# Patient Record
Sex: Female | Born: 1980 | Race: White | Hispanic: No | Marital: Single | Smoking: Current every day smoker
Health system: Southern US, Community
[De-identification: ages and names within clinical notes are randomized; demographics above are authoritative.]

## PROBLEM LIST (undated history)

## (undated) HISTORY — PX: ABDOMINAL HYSTERECTOMY: SHX81

## (undated) HISTORY — PX: MASTECTOMY PARTIAL / LUMPECTOMY: SUR851

---

## 2015-05-01 ENCOUNTER — Emergency Department (HOSPITAL_COMMUNITY)
Admission: EM | Admit: 2015-05-01 | Discharge: 2015-05-01 | Disposition: A | Discharge status: Home / Self Care | Payer: Self-pay | Attending: Emergency Medicine | Admitting: Emergency Medicine

## 2015-05-01 ENCOUNTER — Emergency Department (HOSPITAL_COMMUNITY): Payer: Self-pay

## 2015-05-01 ENCOUNTER — Encounter (HOSPITAL_COMMUNITY): Payer: Self-pay | Admitting: Emergency Medicine

## 2015-05-01 DIAGNOSIS — S83004A Unspecified dislocation of right patella, initial encounter: Secondary | ICD-10-CM | POA: Insufficient documentation

## 2015-05-01 DIAGNOSIS — F17200 Nicotine dependence, unspecified, uncomplicated: Secondary | ICD-10-CM | POA: Insufficient documentation

## 2015-05-01 DIAGNOSIS — W01198A Fall on same level from slipping, tripping and stumbling with subsequent striking against other object, initial encounter: Secondary | ICD-10-CM | POA: Insufficient documentation

## 2015-05-01 DIAGNOSIS — Y9389 Activity, other specified: Secondary | ICD-10-CM | POA: Insufficient documentation

## 2015-05-01 DIAGNOSIS — Y9289 Other specified places as the place of occurrence of the external cause: Secondary | ICD-10-CM | POA: Insufficient documentation

## 2015-05-01 DIAGNOSIS — Y998 Other external cause status: Secondary | ICD-10-CM | POA: Insufficient documentation

## 2015-05-01 MED ORDER — OXYCODONE HCL 5 MG PO TABS: 2.5000 mg | ORAL_TABLET | Freq: Four times a day (QID) | ORAL | Status: AC | PRN

## 2015-05-01 MED ORDER — OXYCODONE HCL 5 MG PO TABS
5.0000 mg | ORAL_TABLET | Freq: Once | ORAL | Status: AC
Start: 2015-05-01 — End: 2015-05-01
  Administered 2015-05-01: 5 mg via ORAL
  Filled 2015-05-01: qty 1

## 2015-05-01 NOTE — ED Provider Notes (Signed)
CSN: [id number]     Arrival date & time 05/01/15  1538 History  By signing my name below, I, [name], attest that this documentation has been prepared under the direction and in the presence of non-physician practitioner, Wynetta Emery, PA-C. Electronically Signed: [name], Scribe. 05/01/2015. 4:54 PM.   Chief Complaint  Patient presents with  . Knee Pain    right    The history is provided by the patient. No language interpreter was used.     HPI Comments: Alexa Mitchell is a 35 y.o. female brought in by EMS who presents to the Emergency Department complaining of sudden onset, constant, right knee pain s/p injury sustained yesterday. Pt states that she slipped and fell in a port-a-potty yesterday and struck her right knee on the side of the port-a-potty; her right knee popped out of place and she states that she popped her right knee back into place after the fall. Pt applied ice to her right knee last night and has taken ibuprofen both yesterday and today with no relief. She reports that she stood up today and hyperextended her right knee, so she called EMS and came to the ER due to significant pain. Pt notes that she did not suffer a laceration to her right knee, only bruising. Pt states that she is not a local resident. She is allergic to Toradol and Tramadol. She denies any other pains.   History reviewed. No pertinent past medical history. Past Surgical History  Procedure Laterality Date  . Abdominal hysterectomy    . Mastectomy partial / lumpectomy     No family history on file. Social History  Substance Use Topics  . Smoking status: Current Every Day Smoker -- 0.50 packs/day  . Smokeless tobacco: None  . Alcohol Use: Yes     Comment: socially    OB History    No data available     Review of Systems  A complete 10 system review of systems was obtained and all systems are negative except as noted in the HPI and PMH.   Allergies  Toradol and Tramadol  Home  Medications   Prior to Admission medications   Not on File   BP 120/82 mmHg  Pulse 90  Temp(Src) 98 F (36.7 C) (Oral)  Resp 18  Ht  (1.626 m)  Wt 145 lb (65.772 kg)  BMI 24.88 kg/m2  SpO2 100% Physical Exam  Constitutional: She is oriented to person, place, and time. She appears well-developed and well-nourished. No distress.  HENT:  Head: Normocephalic and atraumatic.  Eyes: Conjunctivae and EOM are normal.  Neck: Neck supple. No tracheal deviation present.  Cardiovascular: Normal rate.   Pulmonary/Chest: Effort normal. No respiratory distress.  Musculoskeletal: Normal range of motion. She exhibits tenderness.  Right knee:  No deformity, erythema or abrasions. FROM. Diffusely TTP. No effusion or crepitance. Anterior and posterior drawer show no abnormal laxity. Stable to valgus and varus stress. Joint lines are non-tender. Neurovascularly intact. Pt ambulates with antalgic gait.    Neurological: She is alert and oriented to person, place, and time.  Skin: Skin is warm and dry.  Psychiatric: She has a normal mood and affect. Her behavior is normal.  Nursing note and vitals reviewed.   ED Course  Procedures (including critical care time)  DIAGNOSTIC STUDIES: Oxygen Saturation is 100% on RA, normal by my interpretation.    COORDINATION OF CARE: 4:54 PM Discussed treatment plan with pt at bedside and pt agreed to plan.  Labs  Review Labs Reviewed - No data to display  Imaging Review Dg Knee Complete 4 Views Right  05/01/2015  CLINICAL DATA:  Right knee pain after fall yesterday, dislocation yesterday. EXAM: RIGHT KNEE - COMPLETE 4+ VIEW COMPARISON:  None. FINDINGS: Osseous alignment is normal at this time. No fracture line or displaced fracture fragment identified. Overall bone mineralization is normal. No acute or suspicious osseous lesion. There is a probable joint effusion within the suprapatellar bursa. The surrounding superficial soft tissues are unremarkable.  IMPRESSION: 1. No osseous fracture or dislocation at this time. 2. Probable joint effusion. Electronically Signed   By: Bary RichardStan  Maynard M.D.   On: 05/01/2015 16:45   I have personally reviewed and evaluated these images and lab results as part of my medical decision-making.   EKG Interpretation None      MDM   Final diagnoses:  Patellar dislocation, right, initial encounter    Filed Vitals:   05/01/15 1553 05/01/15 1729  BP: 120/82   Pulse: 90 88  Temp: 98 F (36.7 C)   TempSrc: Oral   Resp: 18   Height: 5\' 4"  (1.626 m)   Weight: 96.04565.772 kg   SpO2: 100%     Medications  oxyCODONE (Oxy IR/ROXICODONE) immediate release tablet 5 mg (5 mg Oral Given 05/01/15 1728)    Alexa Mitchell is 35 y.o. female presenting with Right knee pain after she slipped yesterday. Patient describes what may have been a patellar dislocation which she reduced herself. She continues to have pain, neurovascularly intact, no gross ligamentous instability. X-ray negative. Patient is given crutches, knee immobilizer and orthopedic referral. She has anaphylactic reaction to both Toradol and tramadol, short course of oxycodone for pain.  Evaluation does not show pathology that would require ongoing emergent intervention or inpatient treatment. Pt is hemodynamically stable and mentating appropriately. Discussed findings and plan with patient/guardian, who agrees with care plan. All questions answered. Return precautions discussed and outpatient follow up given.   Discharge Medication List as of 05/01/2015  5:06 PM    START taking these medications   Details  oxyCODONE (ROXICODONE) 5 MG immediate release tablet Take 0.5-1 tablets (2.5-5 mg total) by mouth every 6 (six) hours as needed., Starting 05/01/2015, Until Discontinued, Print         I personally performed the services described in this documentation, which was scribed in my presence. The recorded information has been reviewed and is accurate.   Wynetta Emeryicole  Diago Haik, PA-C 05/02/15 1012  Arby BarretteMarcy Pfeiffer, MD 05/04/15 2256

## 2015-05-01 NOTE — ED Notes (Signed)
Per EMS pt was in a port-a-potty yesterday and slipped. Pt states her knee "popped out of place and then she pushed it back into place". Pt states she iced it and took motrin last night and today. Pt states that today when she went to stand up, she hyperextended her knee and now she can not manage her pain with motrin.

## 2015-05-01 NOTE — Discharge Instructions (Signed)
Take oxycodone for breakthrough pain, do not drink alcohol, drive, care for children or do other critical tasks while taking oxycodone.  Keep the knee immobilizer on at all times including when sleeping except when bathing. Do not stop using the knee immobilizer until you are cleared by your orthopedist.  Do not hesitate to return to the emergency room for any new, worsening or concerning symptoms.  Please obtain primary care using resource guide below. Let them know that you were seen in the emergency room and that they will need to obtain records for further outpatient management.    Patellar Dislocation A patellar dislocation occurs when your kneecap (patella) slips out of its normal position in a groove in front of the lower end of your thighbone (femur). This groove is called the patellofemoral groove.  CAUSES The kneecap is normally positioned over the front of the knee joint at the base of the thighbone. A kneecap can be dislocated when:  The kneecap is out of place (patellar tracking disorder), and force is applied.  The foot is firmly planted pointing outward, and the knee bends with the thigh turned inward. This kind of injury is common during many sports activities.  The inner edge of the kneecap is hit, pushing it toward the outer side of the leg. SIGNS AND SYMPTOMS  Severe pain.  A misshapen knee that looks like a bone is out of position.  A popping sensation, followed by a feeling that something is out of place.  Inability to bend or straighten the knee.  Knee swelling.  Cool, pale skin or numbness and tingling in or below the affected knee. DIAGNOSIS  Your health care provider will physically examine the injured area. An X-ray exam may be done to make sure a bone fracture has not occurred. In some cases, your health care provider may look inside your knee joint with an instrument much like a pencil-sized telescope (arthroscope). This may be done to make sure you have  no loose cartilage in your joint. Loose cartilage is not visible on an X-ray image. TREATMENT  In many instances, the patella can be guided back into position without much difficulty. It often goes back into position by straightening the leg. Often, nothing more may be needed other than a brief period of immobilization followed by the exercises your health care provider recommends. If patellar dislocation starts to become frequent after the first incident, surgery may be needed to prevent your patella from slipping out of place. HOME CARE INSTRUCTIONS   Only take over-the-counter or prescription medicines for pain, discomfort, or fever as directed by your health care provider.  Use a knee brace if directed to do so by your health care provider.  Use crutches as instructed.  Apply ice to the injured knee:  Put ice in a plastic bag.  Place a towel between your skin and the bag.  Leave the ice on for 20 minutes, 2-3 times a day.  Follow your health care provider's instructions for doing any recommended range-of-motion exercises or other exercises. SEEK IMMEDIATE MEDICAL CARE IF:  You have increased pain or swelling in the knee that is not relieved with medicine.  You have increasing inflammation in the knee.  You have locking or catching of your knee. MAKE SURE YOU:  Understand these instructions.  Will watch your condition.  Will get help right away if you are not doing well or get worse.   This information is not intended to replace advice given to you  by your health care provider. Make sure you discuss any questions you have with your health care provider.   Document Released: 09/13/2000 Document Revised: 10/09/2012 Document Reviewed: 07/31/2012 Elsevier Interactive Patient Education 2016 [employer]. [employer] Assistance The United Ways 211 is a great source of information about community services available.  Access by dialing 2-1-1 from anywhere  in [place], or by website -  PooledIncome.pl.   Other Local Resources (Updated 01/2015)  Financial Assistance   Services    Phone Number and Address  [facility]  Low-cost medical care - 1st and 3rd Saturday of every month  Must not qualify for public or private insurance and must have limited income [phone] [address] [place], [place]     [employer] of Social Services  Child care  Emergency assistance for housing and [employer]  Medicaid [phone] [address] [place], [place] [address]   [facility]  Low-cost medical care for children, communicable diseases, sexually-transmitted diseases, immunizations, maternity care, womens health and family planning [phone] [address] [place], [place] [address]  [facility]   Medication assistance for [facility] residents  Must meet income requirements [phone] [address] [place], [place].    [facility]  Child care  Emergency assistance for housing and [employer]  Medicaid [phone] [address] [place], [place] [address]  [facility]   Low-cost medical care,   Monday through Friday, 9 am to 6 pm.   Accepts Medicare/Medicaid, and self-pay [phone] [address] [place], [place] [address]  [facility]  Low-cost medical care - Monday through Friday, 8:30 am - 5:30 pm  Accepts Medicaid and self-pay [phone] [address] [place], [place] [address]   [facility]  Primary medical care, including for those with sickle cell disease  Accepts Medicare, Medicaid, insurance and self-pay [phone] [address] [place], [place]  [facility]   Primary medical care  Accepts Medicare, [place], insurance and  self-pay [phone] [address] [place], [place] [address]   [facility] Department of Social Services  Child care  Emergency assistance for housing and [employer]  Medicaid [phone] [address] [place], [place] [address]  [facility] Department of Health and [employer]  Child care  Emergency assistance for housing and [employer]  Medicaid [phone] [address] [place], [place] [address]   [facility] Medication Assistance Program  Medication assistance for [facility] residents with no insurance only  Must have a primary care doctor [phone] [address] [place], [place]  [facility]   Primary medical care  [place], [place], insurance  [phone] [address] [place], [place]  [facility]   Medication assistance [phone]  [facility]   Primary medical care  Accepts Medicare, [place], insurance and self-pay [phone] [address] [place], [place] [address]  [facility]   Primary medical care  Accepts Medicare, [place], insurance and self-pay [phone] [address] [place], [place] [address]  [facility]  For [place] residents between the ages of 55 and 4 who do not have any form of health insurance, Medicare, [place], or [place] benefits.  Services are provided free of charge to uninsured patients who fall within federal  poverty guidelines.    Hours: Tuesdays and Thursdays, 4:15 - 8 pm [phone] [address] [place], [place] [address]  [facility]     Primary medical care  Dental care  Nutritional counseling  Pharmacy  Accepts Medicaid, Medicare, most insurance.  Fees are adjusted based on ability to pay.   [phone] [facility] [address] [place], [place]  [phone] [facility] [address] [place], [place]  [phone] [facility] [place], [place]  [phone] [facility], [address] [place], [place]  [phone] [facility] [address] [place], [place]  [facility]  Womens health and family planning [phone] [address] [place], [place]  [facility] Department of Social Services  Child care  Emergency assistance for housing and [employer]  Medicaid [phone] [address] [place], [place] [address]   [facility]    Ages 62 and older  Hours: Mondays and Thursdays, 7:00 am - 9:00 am Patients are seen on a first come, first served basis. [phone], ext. [phone] [place], [place]  [facility] of Social Services  Child care  Emergency assistance for housing and [employer]  Medicaid [id number] [address] [place], [place] [address]  [facility]  Medication assistance  Rental assistance  Food pantry  Medication assistance  Housing assistance  Emergency food distribution  Utility assistance [phone] [address] [place], [place]  [phone]  [address] [place], [place] [address] Hours: Tuesdays and Thursdays from 9am - 12 noon by appointment only  [phone] [address] [place], [place] [address]  [facility] - Lanae Boast   Accepts private insurance, [place], and [place].  Payment is based on a sliding scale for those without insurance.  Hours: Mondays, Tuesdays and Thursdays, 8:30 am - 5:30 pm.   [phone] [address] [place], [place]  [facility] - Family Medicine at [facility], [place], and [place].  Payment is based on a sliding scale for those without insurance. [phone] [address] [place], [place]  [facility] - Pediatrics at [address]. Scientist, research (physical sciences), [employer], and [place].  Payment is based on a sliding scale for those without insurance [phone] [address], [employer], [place]  [facility] - Pediatrics at [employer], [place], and [place].  Payment is based on a sliding scale for those without insurance. [phone] [address] [place], [place]  [facility] - Pediatrics at [facility], [place], and [place].  Payment is based on a sliding scale for those without insurance. [phone], ext. [phone] [place], [place].    [facility]  Maternity care.  Accepts Medicaid and self-pay. [phone] [address] [place], [place]

## 2017-10-16 IMAGING — CR DG KNEE COMPLETE 4+V*R*
4 series · 4 of 4 positions shown · non-contrast
Comparison: None.

CLINICAL DATA: Right knee pain after fall yesterday, dislocation
yesterday.

EXAM:
RIGHT KNEE - COMPLETE 4+ VIEW

[t knee ap right]
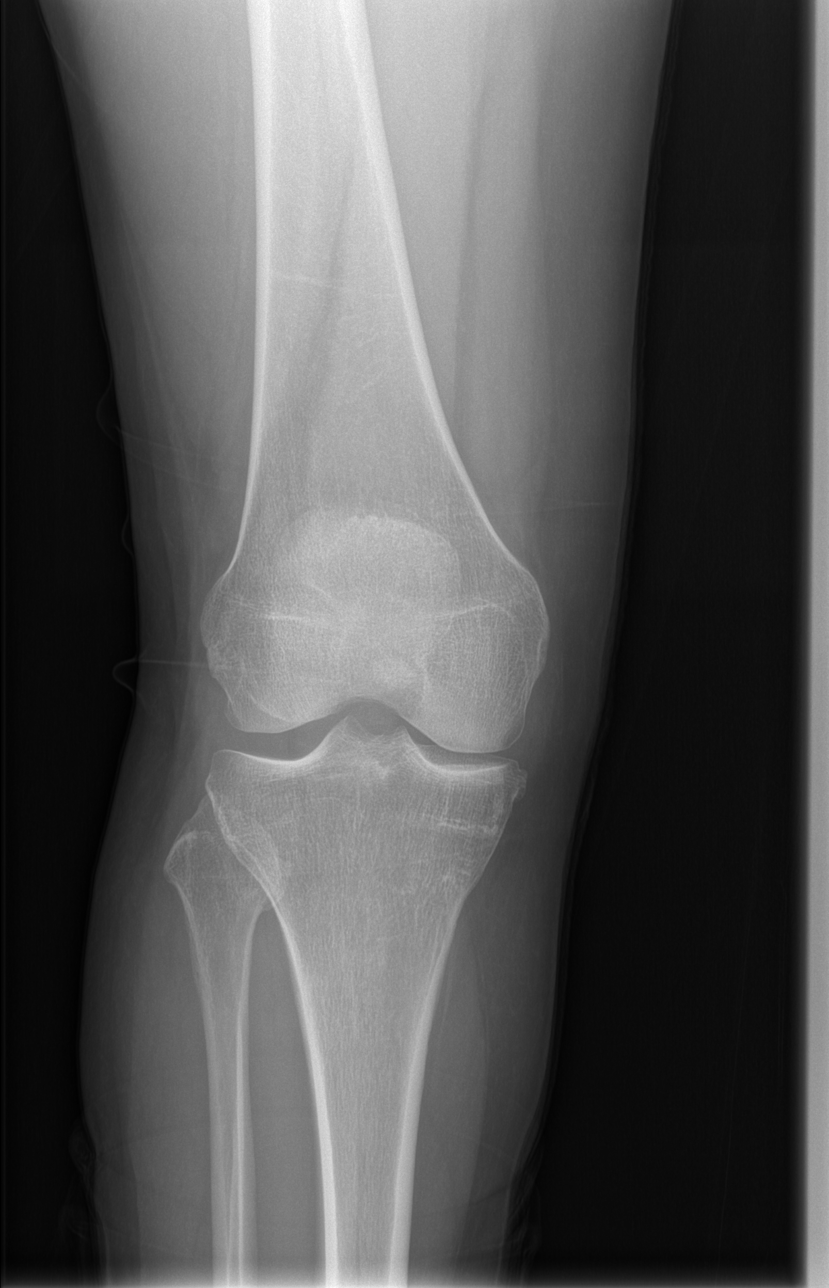

[t knee obl right (1 of 2)]
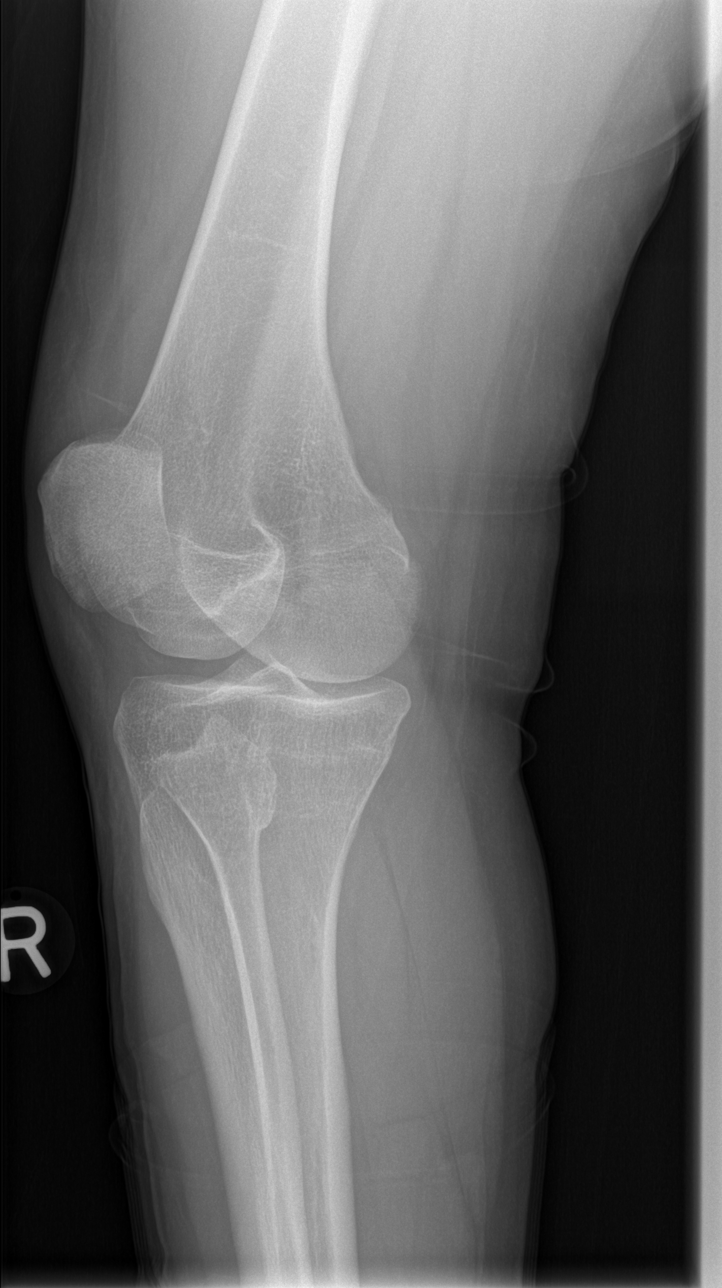

[t knee obl right (2 of 2)]
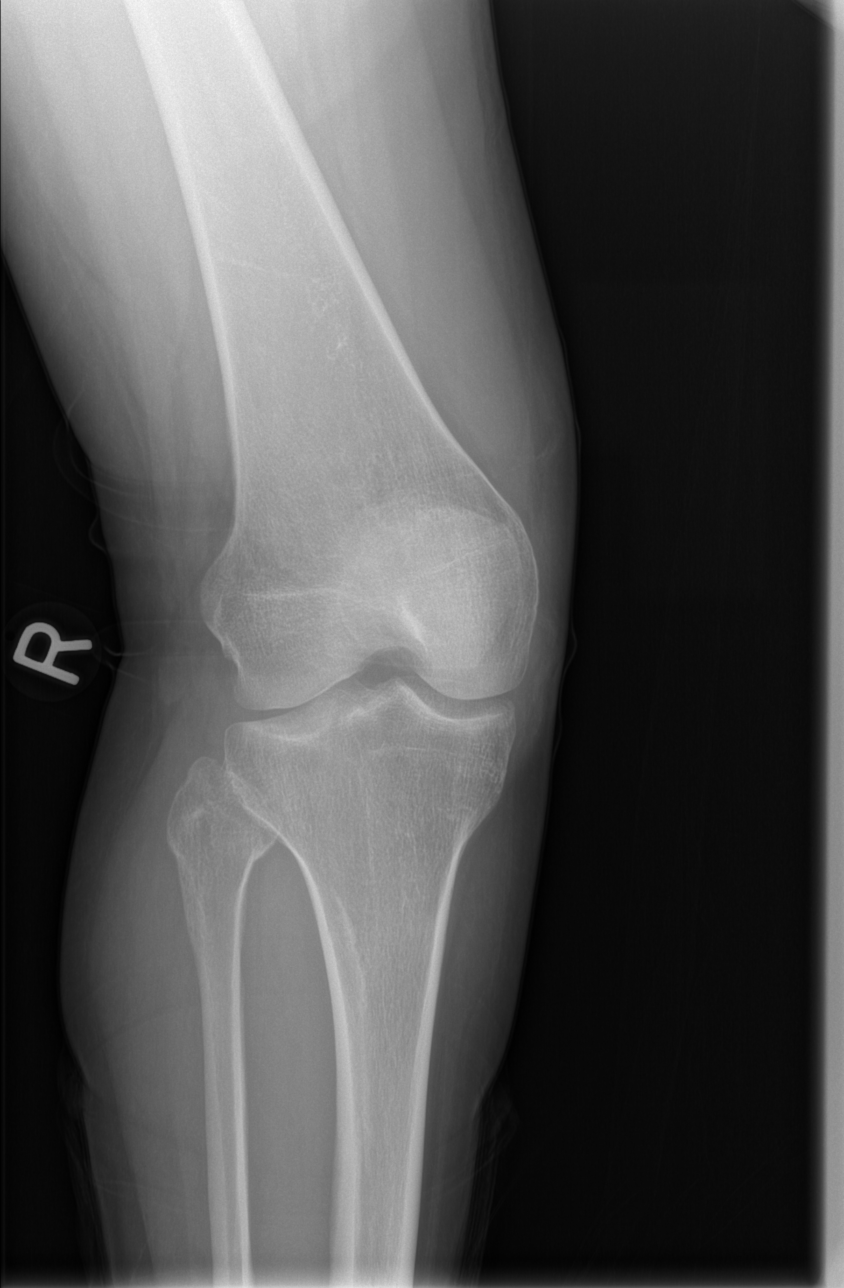

[t knee lat right]
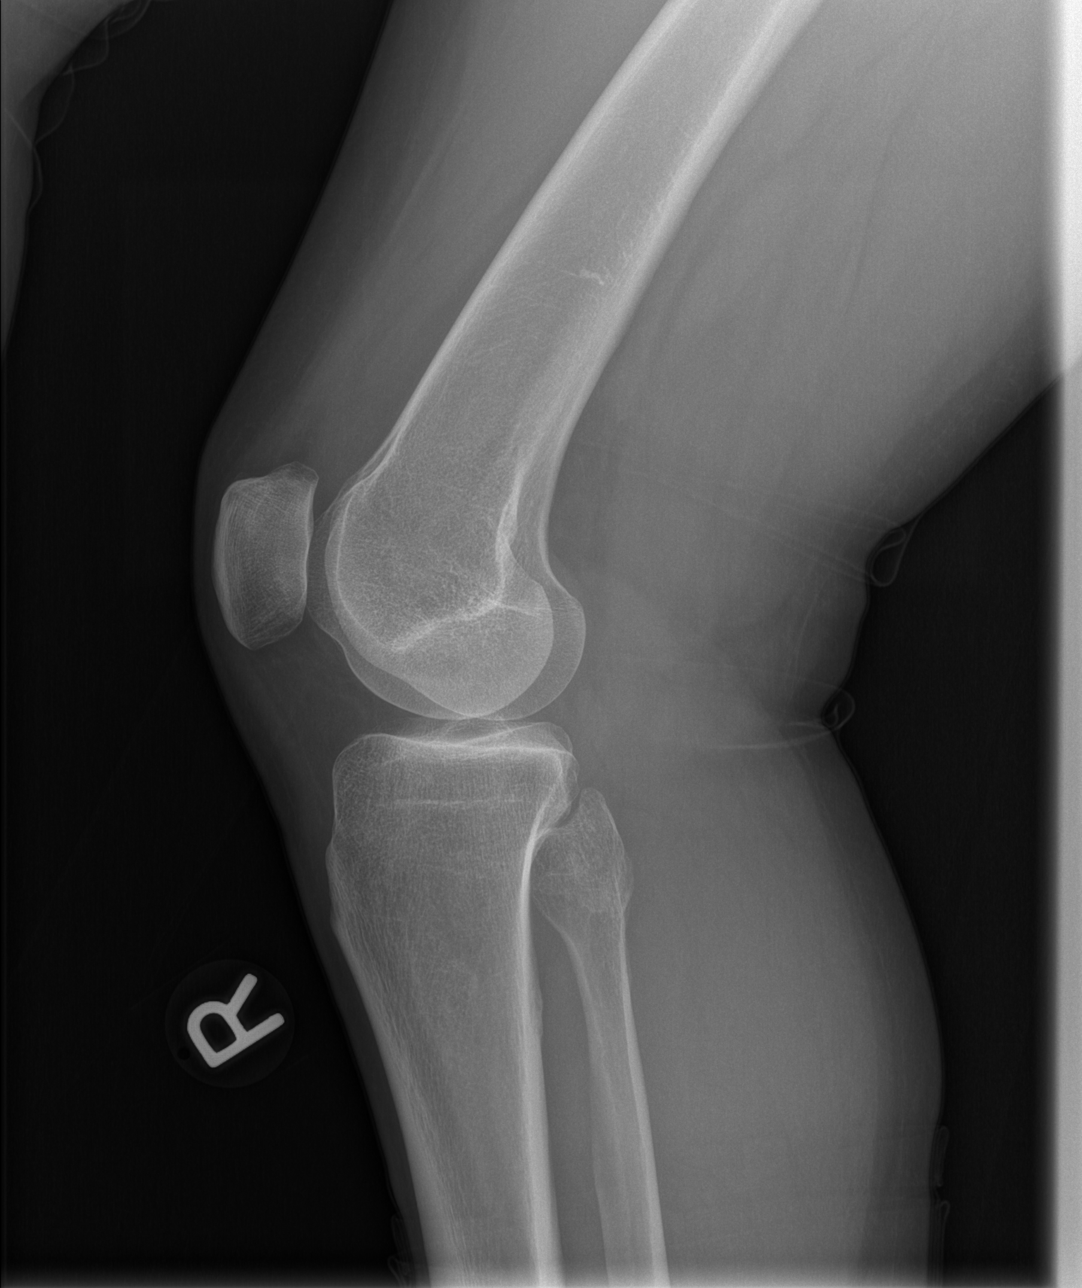

[4 of 4 positions shown; findings below may reference images not displayed]

FINDINGS: Osseous alignment is normal at this time. No fracture line or
displaced fracture fragment identified. Overall bone mineralization
is normal. No acute or suspicious osseous lesion.

There is a probable joint effusion within the suprapatellar bursa.
The surrounding superficial soft tissues are unremarkable.
IMPRESSION: 1. No osseous fracture or dislocation at this time.
2. Probable joint effusion.
